# Patient Record
Sex: Male | Born: 1965 | Hispanic: Yes | Marital: Married | Smoking: Light tobacco smoker
Health system: Southern US, Community
[De-identification: ages and names within clinical notes are randomized; demographics above are authoritative.]

## PROBLEM LIST (undated history)

## (undated) DIAGNOSIS — J45909 Unspecified asthma, uncomplicated: Secondary | ICD-10-CM

## (undated) DIAGNOSIS — R7303 Prediabetes: Secondary | ICD-10-CM

## (undated) DIAGNOSIS — J302 Other seasonal allergic rhinitis: Secondary | ICD-10-CM

## (undated) HISTORY — PX: OTHER SURGICAL HISTORY: SHX169

---

## 2014-12-04 ENCOUNTER — Ambulatory Visit: Payer: Self-pay | Admitting: Unknown Physician Specialty

## 2015-06-14 ENCOUNTER — Encounter: Payer: Self-pay | Admitting: *Deleted

## 2015-06-15 ENCOUNTER — Ambulatory Visit
Admission: RE | Admit: 2015-06-15 | Discharge: 2015-06-15 | Disposition: A | Discharge status: Home / Self Care | Payer: Medicaid Other | Source: Ambulatory Visit | Attending: Gastroenterology | Admitting: Gastroenterology

## 2015-06-15 ENCOUNTER — Ambulatory Visit: Payer: Medicaid Other | Admitting: Anesthesiology

## 2015-06-15 ENCOUNTER — Encounter
Admission: RE | Disposition: A | Discharge status: Home / Self Care | Payer: Self-pay | Source: Ambulatory Visit | Attending: Gastroenterology

## 2015-06-15 DIAGNOSIS — Z79899 Other long term (current) drug therapy: Secondary | ICD-10-CM | POA: Diagnosis not present

## 2015-06-15 DIAGNOSIS — E669 Obesity, unspecified: Secondary | ICD-10-CM | POA: Diagnosis not present

## 2015-06-15 DIAGNOSIS — E119 Type 2 diabetes mellitus without complications: Secondary | ICD-10-CM | POA: Insufficient documentation

## 2015-06-15 DIAGNOSIS — D122 Benign neoplasm of ascending colon: Secondary | ICD-10-CM | POA: Diagnosis not present

## 2015-06-15 DIAGNOSIS — J45909 Unspecified asthma, uncomplicated: Secondary | ICD-10-CM | POA: Diagnosis not present

## 2015-06-15 DIAGNOSIS — F17210 Nicotine dependence, cigarettes, uncomplicated: Secondary | ICD-10-CM | POA: Insufficient documentation

## 2015-06-15 DIAGNOSIS — D124 Benign neoplasm of descending colon: Secondary | ICD-10-CM | POA: Insufficient documentation

## 2015-06-15 DIAGNOSIS — Z1211 Encounter for screening for malignant neoplasm of colon: Secondary | ICD-10-CM | POA: Diagnosis not present

## 2015-06-15 HISTORY — DX: Unspecified asthma, uncomplicated: J45.909

## 2015-06-15 HISTORY — PX: COLONOSCOPY WITH PROPOFOL: SHX5780

## 2015-06-15 HISTORY — DX: Other seasonal allergic rhinitis: J30.2

## 2015-06-15 LAB — GLUCOSE, CAPILLARY: Glucose-Capillary: 84 mg/dL (ref 65–99)

## 2015-06-15 SURGERY — COLONOSCOPY WITH PROPOFOL
Anesthesia: General

## 2015-06-15 MED ORDER — MIDAZOLAM HCL 2 MG/2ML IJ SOLN
INTRAMUSCULAR | Status: DC | PRN
  Administered 2015-06-15: 1 mg via INTRAVENOUS

## 2015-06-15 MED ORDER — LIDOCAINE HCL (CARDIAC) 20 MG/ML IV SOLN
INTRAVENOUS | Status: DC | PRN
  Administered 2015-06-15: 60 mg via INTRAVENOUS

## 2015-06-15 MED ORDER — SODIUM CHLORIDE 0.9 % IV SOLN
INTRAVENOUS | Status: DC
  Administered 2015-06-15: 18:00:00 via INTRAVENOUS
  Administered 2015-06-15: 1000 mL via INTRAVENOUS

## 2015-06-15 MED ORDER — SPOT INK MARKER SYRINGE KIT
PACK | SUBMUCOSAL | Status: DC | PRN
  Administered 2015-06-15: 4 mL via SUBMUCOSAL

## 2015-06-15 MED ORDER — PROPOFOL 10 MG/ML IV BOLUS
INTRAVENOUS | Status: DC | PRN
  Administered 2015-06-15: 100 mg via INTRAVENOUS

## 2015-06-15 MED ORDER — PROPOFOL INFUSION 10 MG/ML OPTIME
INTRAVENOUS | Status: DC | PRN
  Administered 2015-06-15: 120 ug/kg/min via INTRAVENOUS

## 2015-06-15 NOTE — H&P (Signed)
Outpatient short stay form Pre-procedure 06/15/2015 5:15 PM Lollie Sails MD  Primary Physician: Dr Gwynneth Aliment  Reason for visit:  Colonoscopy  History of present illness:  Patient is a 49 year old male setting for colonoscopy in regards to change of bowel habits with diarrhea. He also apparently has a family history of some type of mass lesion in the colon, family members were uncertain other this could've been colon cancer or polyp or more specifics. He takes no aspirin or NSAID products or anticoagulation medicines. He tolerated his prep well    Current facility-administered medications:  .  0.9 %  sodium chloride infusion, , Intravenous, Continuous, Lollie Sails, MD, Last Rate: 20 mL/hr at 06/15/15 1522, 1,000 mL at 06/15/15 1522  Prescriptions prior to admission  Medication Sig Dispense Refill Last Dose  . albuterol (PROVENTIL HFA;VENTOLIN HFA) 108 (90 BASE) MCG/ACT inhaler Inhale 2 puffs into the lungs every 6 (six) hours as needed for wheezing or shortness of breath.   Past Week at Unknown time  . fluticasone (FLONASE) 50 MCG/ACT nasal spray Place 2 sprays into both nostrils daily.   Past Week at Unknown time  . ipratropium-albuterol (DUONEB) 0.5-2.5 (3) MG/3ML SOLN Take 2.5 mLs by nebulization every 6 (six) hours as needed (WHEEZING).   Past Week at Unknown time  . loperamide (IMODIUM) 2 MG capsule Take 2 mg by mouth as needed for diarrhea or loose stools. 4 (four) times daily as needed for diarrhea   Past Week at Unknown time  . loratadine (CLARITIN) 10 MG tablet Take 10 mg by mouth daily.   Past Month at Unknown time  . psyllium (METAMUCIL) 58.6 % packet Take 1 packet by mouth daily. 3.4 gram packet   Past Week at Unknown time  . metFORMIN (GLUCOPHAGE) 500 MG tablet Take by mouth 2 (two) times daily with a meal.   Not Taking at Unknown time  . naproxen (EC NAPROSYN) 500 MG EC tablet Take 500 mg by mouth 2 (two) times daily with a meal.   Completed Course at Unknown time      No Known Allergies   Past Medical History  Diagnosis Date  . Asthma   . Diabetes mellitus without complication   . Seasonal allergies     Review of systems:      Physical Exam    Heart and lungs: Regular rate and rhythm without rub or gallop lungs are bilaterally clear    HEENT: Norm cephalic atraumatic eyes are anicteric    Other:     Pertinant exam for procedure: Soft nontender nondistended bowel sounds positive normoactive slight protuberance.    Planned proceedures: Anoscopy and indicated procedures I have discussed the risks benefits and complications of procedures to include not limited to bleeding, infection, perforation and the risk of sedation and the patient wishes to proceed.    Lollie Sails, MD Gastroenterology 06/15/2015  5:15 PM

## 2015-06-15 NOTE — Anesthesia Postprocedure Evaluation (Signed)
  Anesthesia Post-op Note  Patient: Chad Flynn  Procedure(s) Performed: Procedure(s): COLONOSCOPY WITH PROPOFOL (N/A)  Anesthesia type:General  Patient location: PACU  Post pain: Pain level controlled  Post assessment: Post-op Vital signs reviewed, Patient's Cardiovascular Status Stable, Respiratory Function Stable, Patent Airway and No signs of Nausea or vomiting  Post vital signs: Reviewed and stable  Last Vitals:  Filed Vitals:   06/15/15 1835  BP: 110/80  Pulse: 71  Temp:   Resp: 15    Level of consciousness: awake, alert  and patient cooperative  Complications: No apparent anesthesia complications

## 2015-06-15 NOTE — Op Note (Signed)
[facility] Gastroenterology Patient Name: Chad Flynn Procedure Date: 06/15/2015 5:01 PM MRN: [id number] Account #: [id number] Date of Birth: [birth year] Admit Type: Outpatient Age: 49 Room: [facility] ENDO ROOM 3 Gender: Male Note Status: Finalized Procedure:         Colonoscopy Indications:       Screening for colorectal malignant neoplasm Providers:         Lollie Sails, MD Medicines:         Monitored Anesthesia Care Complications:     No immediate complications. Procedure:         Pre-Anesthesia Assessment:                    - ASA Grade Assessment: II - A patient with mild systemic                     disease.                    After obtaining informed consent, the colonoscope was                     passed under direct vision. Throughout the procedure, the                     patient's blood pressure, pulse, and oxygen saturations                     were monitored continuously. The Colonoscope was                     introduced through the anus and advanced to the the cecum,                     identified by appendiceal orifice and ileocecal valve. The                     colonoscopy was performed with moderate difficulty. The                     patient tolerated the procedure well. The quality of the                     bowel preparation was fair except the ascending colon was                     poor. Findings:      A 6 mm polyp was found in the proximal ascending colon. The polyp was       sessile. The polyp was removed with a cold snare. Resection and       retrieval were complete.      A 25 mm polyp was found in the proximal descending colon. The polyp was       sessile. The polyp was removed with a hot snare. The polyp was removed       with a saline injection-lift technique using a hot snare. The polyp was       removed with a piecemeal technique using a hot snare. Resection was       complete, but the polyp tissue was only partially  retrieved. Area was       tattooed with an injection of 4 mL of [place] ink. Two hemostatic clips       were successfully placed. There was no bleeding at the end of the  maneuver.      A 3 mm polyp was found in the distal sigmoid colon. The polyp was       sessile. The polyp was removed with a cold biopsy forceps. Resection and       retrieval were complete.      Biopsies for histology were taken with a cold forceps from the right       colon and left colon for evaluation of microscopic colitis.      The digital rectal exam was normal. Impression:        - One 6 mm polyp in the proximal ascending colon. Resected                     and retrieved.                    - One 25 mm polyp in the proximal descending colon.                     Complete resection. Partial retrieval. Tattooed. Clips                     were placed. Recommendation:    - Await pathology results.                    - Telephone GI clinic for pathology results in 1 week. Procedure Code(s): --- Professional ---                    [phone], 75, Colonoscopy, flexible; with endoscopic mucosal                     resection                    [phone], Colonoscopy, flexible; with removal of tumor(s),                     polyp(s), or other lesion(s) by snare technique                    [id number], 60, Colonoscopy, flexible; with biopsy, single or                     multiple                    45381, 59, Colonoscopy, flexible; with directed submucosal                     injection(s), any substance Diagnosis Code(s): --- Professional ---                    V76.51, Special screening for malignant neoplasms of colon                    211.3, Benign neoplasm of colon CPT copyright 2014 American Medical Association. All rights reserved. The codes documented in this report are preliminary and upon coder review may  be revised to meet current compliance requirements. Lollie Sails, MD 06/15/2015 6:21:24 PM This report has been  signed electronically. Number of Addenda: 0 Note Initiated On: 06/15/2015 5:01 PM Scope Withdrawal Time: 0 hours 41 minutes 35 seconds  Total Procedure Duration: 0 hours 50 minutes 15 seconds       [facility]

## 2015-06-15 NOTE — Anesthesia Preprocedure Evaluation (Signed)
Anesthesia Evaluation  Patient identified by MRN, date of birth, ID band Patient awake    Reviewed: Allergy & Precautions, NPO status , Patient's Chart, lab work & pertinent test results  History of Anesthesia Complications Negative for: history of anesthetic complications  Airway Mallampati: III       Dental no notable dental hx.    Pulmonary asthma , Current Smoker,    + decreased breath sounds      Cardiovascular negative cardio ROS Normal cardiovascular exam    Neuro/Psych    GI/Hepatic negative GI ROS, Neg liver ROS,   Endo/Other  diabetes, Type 2  Renal/GU      Musculoskeletal   Abdominal (+) + obese,   Peds negative pediatric ROS (+)  Hematology   Anesthesia Other Findings   Reproductive/Obstetrics                             Anesthesia Physical Anesthesia Plan  ASA: II  Anesthesia Plan: General   Post-op Pain Management:    Induction: Intravenous  Airway Management Planned: Nasal Cannula  Additional Equipment:   Intra-op Plan:   Post-operative Plan:   Informed Consent: I have reviewed the patients History and Physical, chart, labs and discussed the procedure including the risks, benefits and alternatives for the proposed anesthesia with the patient or authorized representative who has indicated his/her understanding and acceptance.     Plan Discussed with: CRNA  Anesthesia Plan Comments:         Anesthesia Quick Evaluation

## 2015-06-15 NOTE — Transfer of Care (Signed)
Immediate Anesthesia Transfer of Care Note  Patient: Chad Flynn  Procedure(s) Performed: Procedure(s): COLONOSCOPY WITH PROPOFOL (N/A)  Patient Location: PACU  Anesthesia Type:General  Level of Consciousness: awake, alert , oriented and patient cooperative  Airway & Oxygen Therapy: Patient Spontanous Breathing  Post-op Assessment: Report given to RN and Post -op Vital signs reviewed and stable  Post vital signs: Reviewed and stable  Last Vitals:  Filed Vitals:   06/15/15 1521  Pulse: 79  Temp: 37.4 C  Resp: 21    Complications: No apparent anesthesia complications

## 2015-06-15 NOTE — OR Nursing (Signed)
Proximal descending colon polyp site lifted with 32mL NS

## 2015-06-16 ENCOUNTER — Encounter: Payer: Self-pay | Admitting: Gastroenterology

## 2015-06-17 LAB — SURGICAL PATHOLOGY

## 2018-03-20 ENCOUNTER — Other Ambulatory Visit: Payer: Self-pay

## 2018-03-20 ENCOUNTER — Telehealth: Payer: Self-pay

## 2018-03-20 DIAGNOSIS — Z86010 Personal history of colonic polyps: Secondary | ICD-10-CM

## 2018-03-20 NOTE — Telephone Encounter (Signed)
Patients colonoscopy instructions have been mailed to him in Spanish(Copies).  Thanks [employer]

## 2018-04-26 ENCOUNTER — Ambulatory Visit: Payer: Self-pay | Admitting: Anesthesiology

## 2018-04-26 ENCOUNTER — Ambulatory Visit
Admission: RE | Admit: 2018-04-26 | Discharge: 2018-04-26 | Disposition: A | Discharge status: Home / Self Care | Payer: Medicaid Other | Source: Ambulatory Visit | Attending: Gastroenterology | Admitting: Gastroenterology

## 2018-04-26 ENCOUNTER — Encounter: Payer: Self-pay | Admitting: *Deleted

## 2018-04-26 ENCOUNTER — Encounter
Admission: RE | Disposition: A | Discharge status: Home / Self Care | Payer: Self-pay | Source: Ambulatory Visit | Attending: Gastroenterology

## 2018-04-26 ENCOUNTER — Other Ambulatory Visit: Payer: Self-pay

## 2018-04-26 DIAGNOSIS — Z860100 Personal history of colon polyps, unspecified: Secondary | ICD-10-CM

## 2018-04-26 DIAGNOSIS — Z79899 Other long term (current) drug therapy: Secondary | ICD-10-CM | POA: Insufficient documentation

## 2018-04-26 DIAGNOSIS — Z1211 Encounter for screening for malignant neoplasm of colon: Secondary | ICD-10-CM | POA: Insufficient documentation

## 2018-04-26 DIAGNOSIS — K648 Other hemorrhoids: Secondary | ICD-10-CM | POA: Insufficient documentation

## 2018-04-26 DIAGNOSIS — Z7984 Long term (current) use of oral hypoglycemic drugs: Secondary | ICD-10-CM | POA: Insufficient documentation

## 2018-04-26 DIAGNOSIS — R7303 Prediabetes: Secondary | ICD-10-CM | POA: Insufficient documentation

## 2018-04-26 HISTORY — DX: Prediabetes: R73.03

## 2018-04-26 HISTORY — PX: COLONOSCOPY WITH PROPOFOL: SHX5780

## 2018-04-26 LAB — GLUCOSE, CAPILLARY: GLUCOSE-CAPILLARY: 103 mg/dL — AB (ref 70–99)

## 2018-04-26 SURGERY — COLONOSCOPY WITH PROPOFOL
Anesthesia: General

## 2018-04-26 MED ORDER — LACTATED RINGERS IV SOLN
INTRAVENOUS | Status: DC | PRN
  Administered 2018-04-26: 09:00:00 via INTRAVENOUS

## 2018-04-26 MED ORDER — PROPOFOL 10 MG/ML IV BOLUS
INTRAVENOUS | Status: AC
  Filled 2018-04-26: qty 60

## 2018-04-26 MED ORDER — SODIUM CHLORIDE 0.9 % IV SOLN
INTRAVENOUS | Status: DC
  Administered 2018-04-26: 09:00:00 via INTRAVENOUS

## 2018-04-26 MED ORDER — PROPOFOL 500 MG/50ML IV EMUL
INTRAVENOUS | Status: DC | PRN
  Administered 2018-04-26: 150 ug/kg/min via INTRAVENOUS

## 2018-04-26 MED ORDER — PROPOFOL 10 MG/ML IV BOLUS
INTRAVENOUS | Status: DC | PRN
  Administered 2018-04-26: 100 mg via INTRAVENOUS

## 2018-04-26 NOTE — Anesthesia Postprocedure Evaluation (Signed)
Anesthesia Post Note  Patient: Chad Flynn  Procedure(s) Performed: COLONOSCOPY WITH PROPOFOL (N/A )  Patient location during evaluation: Endoscopy Anesthesia Type: General Level of consciousness: awake and alert and oriented Pain management: pain level controlled Vital Signs Assessment: post-procedure vital signs reviewed and stable Respiratory status: spontaneous breathing Cardiovascular status: blood pressure returned to baseline Anesthetic complications: no     Last Vitals:  Vitals:   04/26/18 1000 04/26/18 1010  BP: (!) 117/91 111/90  Pulse: 70 61  Resp: 13 10  Temp:    SpO2: 99% 98%    Last Pain:  Vitals:   04/26/18 0940  TempSrc: Tympanic  PainSc:                  [name]

## 2018-04-26 NOTE — Transfer of Care (Signed)
Immediate Anesthesia Transfer of Care Note  Patient: Chad Flynn  Procedure(s) Performed: COLONOSCOPY WITH PROPOFOL (N/A )  Patient Location: PACU  Anesthesia Type:General  Level of Consciousness: sedated  Airway & Oxygen Therapy: Patient Spontanous Breathing and Patient connected to nasal cannula oxygen  Post-op Assessment: Report given to RN and Post -op Vital signs reviewed and stable  Post vital signs: Reviewed and stable  Last Vitals:  Vitals Value Taken Time  BP    Temp    Pulse    Resp    SpO2      Last Pain:  Vitals:   04/26/18 0818  TempSrc: Tympanic  PainSc: 0-No pain         Complications: No apparent anesthesia complications

## 2018-04-26 NOTE — Op Note (Signed)
[facility] Gastroenterology Patient Name: Chad Flynn Procedure Date: 04/26/2018 9:03 AM MRN: [id number] Account #: [id number] Date of Birth: 07/28/1966 Admit Type: Outpatient Age: 52 Room: [facility] ENDO ROOM 4 Gender: Male Note Status: Finalized Procedure:            Colonoscopy Indications:          Surveillance: Personal history of adenomatous polyps on                        last colonoscopy 3 years ago. Only one previous                        colonoscopy. It was done by Dr. Gustavo Lah for screening.                        Report notes a 76mm ascending colon polyp removed and a                        59mm descending colon polyp removed and tattoo placed                        at the descending colon polyp site. Pathology showed                        Tubular adenoma. See previous report for details Providers:            Talullah Abate B. Bonna Gains MD, MD Referring MD:         Theotis Burrow (Referring MD) Medicines:            Monitored Anesthesia Care Complications:        No immediate complications. Procedure:            Pre-Anesthesia Assessment:                       - Prior to the procedure, a History and Physical was                        performed, and patient medications, allergies and                        sensitivities were reviewed. The patient's tolerance of                        previous anesthesia was reviewed.                       - The risks and benefits of the procedure and the                        sedation options and risks were discussed with the                        patient. All questions were answered and informed                        consent was obtained.                       - Patient identification and proposed procedure were  verified prior to the procedure by the physician, the                        nurse, the anesthetist and the technician. The                        procedure was verified in the  pre-procedure area in the                        procedure room in the endoscopy suite.                       - ASA Grade Assessment: II - A patient with mild                        systemic disease.                       - After reviewing the risks and benefits, the patient                        was deemed in satisfactory condition to undergo the                        procedure.                       After obtaining informed consent, the colonoscope was                        passed under direct vision. Throughout the procedure,                        the patient's blood pressure, pulse, and oxygen                        saturations were monitored continuously. The                        Colonoscope was introduced through the anus and                        advanced to the the cecum, identified by appendiceal                        orifice and ileocecal valve. The colonoscopy was                        performed with ease. The patient tolerated the                        procedure well. The quality of the bowel preparation                        was good. Findings:      The perianal and digital rectal examinations were normal.      A tattoo was seen in the transverse colon at a site of previous       tattooing. The tattoo site appeared normal. (The previous procedure       report by Dr. Gustavo Lah mentions the tattoo to be  in the descending       colon, but the tattoo site was seen in the transverse colon).      The rectum, sigmoid colon, descending colon, transverse colon, ascending       colon and cecum appeared normal.      The retroflexed view of the distal rectum and anal verge was normal and       showed no anal or rectal abnormalities.      Internal hemorrhoids were found during retroflexion. Impression:           - A tattoo was seen in the transverse colon. The tattoo                        site appeared normal. (The previous procedure report by                        Dr.  Gustavo Lah mentions the tattoo to be in the                        descending colon, but the tattoo site was seen in the                        transverse colon today. He has had only one colonoscopy                        before today, and only one tattoo was placed at the                        time. This is the thus the same tattoo placed in 2016                        by Dr. Gustavo Lah).                       - The rectum, sigmoid colon, descending colon,                        transverse colon, ascending colon and cecum are normal.                       - The distal rectum and anal verge are normal on                        retroflexion view.                       - Internal hemorrhoids.                       - No specimens collected. Recommendation:       - Discharge patient to home.                       - Resume previous diet.                       - Continue present medications.                       - Repeat colonoscopy in 3 years for surveillance, due  to previous history of advanced adenoma (51mm                        polypectomy in 2016 and guidelines recommend repeat                        surveillance intervals to be based on history of                        advanced adenomas).                       - Return to primary care physician as previously                        scheduled.                       - The findings and recommendations were discussed with                        the patient.                       - The findings and recommendations were discussed with                        the patient's family.                       - High fiber diet. Procedure Code(s):    --- Professional ---                       J8563, Colorectal cancer screening; colonoscopy on                        individual at high risk Diagnosis Code(s):    --- Professional ---                       Z86.010, Personal history of colonic polyps                       K64.8,  Other hemorrhoids CPT copyright 2017 American Medical Association. All rights reserved. The codes documented in this report are preliminary and upon coder review may  be revised to meet current compliance requirements.  Vonda Antigua, MD Margretta Sidle B. Bonna Gains MD, MD 04/26/2018 9:46:58 AM This report has been signed electronically. Number of Addenda: 0 Note Initiated On: 04/26/2018 9:03 AM Scope Withdrawal Time: 0 hours 14 minutes 46 seconds  Total Procedure Duration: 0 hours 22 minutes 8 seconds       [facility]

## 2018-04-26 NOTE — H&P (Signed)
Chad Antigua, MD [address], [place], [place], [place], [address] [address], [place], [place], [place], [address] Phone: [phone]  Fax: [phone]  Primary Care Physician:  Theotis Burrow, MD   Pre-Procedure History & Physical: HPI:  Chad Flynn is a 52 y.o. male is here for a colonoscopy.   Past Medical History:  Diagnosis Date  . Asthma   . Pre-diabetes   . Seasonal allergies     Past Surgical History:  Procedure Laterality Date  . Arthroscopic subacromial decompression of left shoulder, plus incision cuff repair Left   . COLONOSCOPY WITH PROPOFOL N/A 06/15/2015   Procedure: COLONOSCOPY WITH PROPOFOL;  Surgeon: Lollie Sails, MD;  Location: [facility] ENDOSCOPY;  Service: Endoscopy;  Laterality: N/A;    Prior to Admission medications   Medication Sig Start Date End Date Taking? Authorizing Provider  loperamide (IMODIUM) 2 MG capsule Take 2 mg by mouth as needed for diarrhea or loose stools. 4 (four) times daily as needed for diarrhea   Yes [provider]  loratadine (CLARITIN) 10 MG tablet Take 10 mg by mouth daily.   Yes [provider]  naproxen (EC NAPROSYN) 500 MG EC tablet Take 500 mg by mouth 2 (two) times daily with a meal.   Yes [provider]  albuterol (PROVENTIL HFA;VENTOLIN HFA) 108 (90 BASE) MCG/ACT inhaler Inhale 2 puffs into the lungs every 6 (six) hours as needed for wheezing or shortness of breath.    [provider]  fluticasone (FLONASE) 50 MCG/ACT nasal spray Place 2 sprays into both nostrils daily.    [provider]  ipratropium-albuterol (DUONEB) 0.5-2.5 (3) MG/3ML SOLN Take 2.5 mLs by nebulization every 6 (six) hours as needed (WHEEZING).    [provider]  metFORMIN (GLUCOPHAGE) 500 MG tablet Take by mouth 2 (two) times daily with a meal.    [provider]    Allergies as of 03/20/2018  . (No Known Allergies)    History reviewed. No pertinent  family history.  Social History   Socioeconomic History  . Marital status: Married    Spouse name: Not on file  . Number of children: Not on file  . Years of education: Not on file  . Highest education level: Not on file  Occupational History  . Not on file  Social Needs  . Financial resource strain: Not on file  . Food insecurity:    Worry: Not on file    Inability: Not on file  . Transportation needs:    Medical: Not on file    Non-medical: Not on file  Tobacco Use  . Smoking status: Light Tobacco Smoker    Types: Cigarettes  . Smokeless tobacco: Never Used  . Tobacco comment: 1 or 2 per week  Substance and Sexual Activity  . Alcohol use: No  . Drug use: No  . Sexual activity: Not on file  Lifestyle  . Physical activity:    Days per week: Not on file    Minutes per session: Not on file  . Stress: Not on file  Relationships  . Social connections:    Talks on phone: Not on file    Gets together: Not on file    Attends religious service: Not on file    Active member of club or organization: Not on file    Attends meetings of clubs or organizations: Not on file    Relationship status: Not on file  . Intimate partner violence:  Fear of current or ex partner: Not on file    Emotionally abused: Not on file    Physically abused: Not on file    Forced sexual activity: Not on file  Other Topics Concern  . Not on file  Social History Narrative  . Not on file    Review of Systems: See HPI, otherwise negative ROS  Physical Exam: BP 132/86   Pulse 72   Temp (!) 96.2 F (35.7 C) (Tympanic)   Resp 15   Ht 5\' 7"  (1.702 m)   Wt 208 lb (94.3 kg)   SpO2 100%   BMI 32.58 kg/m  General:   Alert,  pleasant and cooperative in NAD Head:  Normocephalic and atraumatic. Neck:  Supple; no masses or thyromegaly. Lungs:  Clear throughout to auscultation, normal respiratory effort.    Heart:  +S1, +S2, Regular rate and rhythm, No edema. Abdomen:  Soft, nontender and  nondistended. Normal bowel sounds, without guarding, and without rebound.   Neurologic:  Alert and  oriented x4;  grossly normal neurologically.  Impression/Plan: Chad Flynn is here for a colonoscopy to be performed for polyp surveillance. Previous colonoscopy in 2016 by Dr. Gustavo Lah, see report in chart.   Risks, benefits, limitations, and alternatives regarding  colonoscopy have been reviewed with the patient.  Questions have been answered.  All parties agreeable.   Virgel Manifold, MD  04/26/2018, 9:08 AM

## 2018-04-26 NOTE — Anesthesia Procedure Notes (Signed)
Date/Time: 04/26/2018 9:15 AM Performed by: Nelda Marseille, CRNA Pre-anesthesia Checklist: Patient identified, Emergency Drugs available, Suction available, Patient being monitored and Timeout performed Oxygen Delivery Method: Nasal cannula

## 2018-04-26 NOTE — H&P (Signed)
Jonathon Bellows, MD [address] [place], [place], [place], [address] [address], [place], [place], [place], [address] Phone: [phone]  Fax: [phone]  Primary Care Physician:  Theotis Burrow, MD   Pre-Procedure History & Physical: HPI:  Chad Flynn is a 52 y.o. male is here for an colonoscopy.   Past Medical History:  Diagnosis Date  . Asthma   . Pre-diabetes   . Seasonal allergies     Past Surgical History:  Procedure Laterality Date  . Arthroscopic subacromial decompression of left shoulder, plus incision cuff repair Left   . COLONOSCOPY WITH PROPOFOL N/A 06/15/2015   Procedure: COLONOSCOPY WITH PROPOFOL;  Surgeon: Lollie Sails, MD;  Location: [facility] ENDOSCOPY;  Service: Endoscopy;  Laterality: N/A;    Prior to Admission medications   Medication Sig Start Date End Date Taking? Authorizing Provider  loperamide (IMODIUM) 2 MG capsule Take 2 mg by mouth as needed for diarrhea or loose stools. 4 (four) times daily as needed for diarrhea   Yes [provider]  loratadine (CLARITIN) 10 MG tablet Take 10 mg by mouth daily.   Yes [provider]  naproxen (EC NAPROSYN) 500 MG EC tablet Take 500 mg by mouth 2 (two) times daily with a meal.   Yes [provider]  albuterol (PROVENTIL HFA;VENTOLIN HFA) 108 (90 BASE) MCG/ACT inhaler Inhale 2 puffs into the lungs every 6 (six) hours as needed for wheezing or shortness of breath.    [provider]  fluticasone (FLONASE) 50 MCG/ACT nasal spray Place 2 sprays into both nostrils daily.    [provider]  ipratropium-albuterol (DUONEB) 0.5-2.5 (3) MG/3ML SOLN Take 2.5 mLs by nebulization every 6 (six) hours as needed (WHEEZING).    [provider]  metFORMIN (GLUCOPHAGE) 500 MG tablet Take by mouth 2 (two) times daily with a meal.    [provider]    Allergies as of 03/20/2018  . (No Known Allergies)    History reviewed. No pertinent  family history.  Social History   Socioeconomic History  . Marital status: Married    Spouse name: Not on file  . Number of children: Not on file  . Years of education: Not on file  . Highest education level: Not on file  Occupational History  . Not on file  Social Needs  . Financial resource strain: Not on file  . Food insecurity:    Worry: Not on file    Inability: Not on file  . Transportation needs:    Medical: Not on file    Non-medical: Not on file  Tobacco Use  . Smoking status: Light Tobacco Smoker    Types: Cigarettes  . Smokeless tobacco: Never Used  . Tobacco comment: 1 or 2 per week  Substance and Sexual Activity  . Alcohol use: No  . Drug use: No  . Sexual activity: Not on file  Lifestyle  . Physical activity:    Days per week: Not on file    Minutes per session: Not on file  . Stress: Not on file  Relationships  . Social connections:    Talks on phone: Not on file    Gets together: Not on file    Attends religious service: Not on file    Active member of club or organization: Not on file    Attends meetings of clubs or organizations: Not on file    Relationship status: Not on file  . Intimate  partner violence:    Fear of current or ex partner: Not on file    Emotionally abused: Not on file    Physically abused: Not on file    Forced sexual activity: Not on file  Other Topics Concern  . Not on file  Social History Narrative  . Not on file    Review of Systems: See HPI, otherwise negative ROS  Physical Exam: BP 132/86   Pulse 72   Temp (!) 96.2 F (35.7 C) (Tympanic)   Resp 15   Ht 5\' 7"  (1.702 m)   Wt 208 lb (94.3 kg)   SpO2 100%   BMI 32.58 kg/m  General:   Alert,  pleasant and cooperative in NAD Head:  Normocephalic and atraumatic. Neck:  Supple; no masses or thyromegaly. Lungs:  Clear throughout to auscultation, normal respiratory effort.    Heart:  +S1, +S2, Regular rate and rhythm, No edema. Abdomen:  Soft, nontender and  nondistended. Normal bowel sounds, without guarding, and without rebound.   Neurologic:  Alert and  oriented x4;  grossly normal neurologically.  Impression/Plan: Chad Flynn is here for an colonoscopy to be performed for surveillance due to prior history of colon polyps   Risks, benefits, limitations, and alternatives regarding  colonoscopy have been reviewed with the patient.  Questions have been answered.  All parties agreeable.   Jonathon Bellows, MD  04/26/2018, 8:38 AM

## 2018-04-26 NOTE — Anesthesia Preprocedure Evaluation (Signed)
Anesthesia Evaluation  Patient identified by MRN, date of birth, ID band Patient awake    Reviewed: Allergy & Precautions, NPO status , Patient's Chart, lab work & pertinent test results  History of Anesthesia Complications Negative for: history of anesthetic complications  Airway Mallampati: III       Dental no notable dental hx.    Pulmonary asthma , Current Smoker,     + decreased breath sounds      Cardiovascular negative cardio ROS Normal cardiovascular exam     Neuro/Psych    GI/Hepatic negative GI ROS, Neg liver ROS,   Endo/Other  diabetes, Type 2  Renal/GU      Musculoskeletal   Abdominal (+) + obese,   Peds negative pediatric ROS (+)  Hematology   Anesthesia Other Findings   Reproductive/Obstetrics                             Anesthesia Physical  Anesthesia Plan  ASA: II  Anesthesia Plan: General   Post-op Pain Management:    Induction: Intravenous  PONV Risk Score and Plan:   Airway Management Planned: Nasal Cannula  Additional Equipment:   Intra-op Plan:   Post-operative Plan:   Informed Consent: I have reviewed the patients History and Physical, chart, labs and discussed the procedure including the risks, benefits and alternatives for the proposed anesthesia with the patient or authorized representative who has indicated his/her understanding and acceptance.     Plan Discussed with: CRNA  Anesthesia Plan Comments:         Anesthesia Quick Evaluation

## 2018-04-26 NOTE — Anesthesia Post-op Follow-up Note (Signed)
Anesthesia QCDR form completed.        

## 2018-11-21 ENCOUNTER — Other Ambulatory Visit: Payer: Self-pay | Admitting: Family Medicine

## 2018-11-21 DIAGNOSIS — M545 Low back pain, unspecified: Secondary | ICD-10-CM

## 2018-11-21 DIAGNOSIS — M542 Cervicalgia: Secondary | ICD-10-CM

## 2018-11-29 ENCOUNTER — Ambulatory Visit
Admission: RE | Admit: 2018-11-29 | Discharge: 2018-11-29 | Disposition: A | Discharge status: Home / Self Care | Payer: No Typology Code available for payment source | Source: Ambulatory Visit | Attending: Family Medicine | Admitting: Family Medicine

## 2018-11-29 DIAGNOSIS — M79602 Pain in left arm: Secondary | ICD-10-CM | POA: Diagnosis not present

## 2018-11-29 DIAGNOSIS — M25512 Pain in left shoulder: Secondary | ICD-10-CM | POA: Diagnosis not present

## 2018-11-29 DIAGNOSIS — M545 Low back pain, unspecified: Secondary | ICD-10-CM

## 2018-11-29 DIAGNOSIS — R51 Headache: Secondary | ICD-10-CM | POA: Insufficient documentation

## 2018-11-29 DIAGNOSIS — M48061 Spinal stenosis, lumbar region without neurogenic claudication: Secondary | ICD-10-CM | POA: Insufficient documentation

## 2018-11-29 DIAGNOSIS — M542 Cervicalgia: Secondary | ICD-10-CM | POA: Insufficient documentation

## 2019-03-04 ENCOUNTER — Other Ambulatory Visit: Payer: Self-pay | Admitting: Neurosurgery

## 2019-03-04 ENCOUNTER — Other Ambulatory Visit (HOSPITAL_COMMUNITY): Payer: Self-pay | Admitting: Neurosurgery

## 2019-03-04 DIAGNOSIS — R519 Headache, unspecified: Secondary | ICD-10-CM

## 2019-03-04 DIAGNOSIS — R51 Headache: Principal | ICD-10-CM

## 2019-03-11 ENCOUNTER — Ambulatory Visit: Admission: RE | Admit: 2019-03-11 | Payer: Medicaid Other | Source: Ambulatory Visit

## 2020-07-26 ENCOUNTER — Other Ambulatory Visit: Payer: Self-pay | Admitting: Neurology

## 2020-07-26 ENCOUNTER — Other Ambulatory Visit (HOSPITAL_COMMUNITY): Payer: Self-pay | Admitting: Neurology

## 2020-07-26 DIAGNOSIS — R4189 Other symptoms and signs involving cognitive functions and awareness: Secondary | ICD-10-CM

## 2020-08-16 ENCOUNTER — Ambulatory Visit: Admission: RE | Admit: 2020-08-16 | Payer: Medicaid Other | Source: Ambulatory Visit

## 2020-09-20 ENCOUNTER — Ambulatory Visit
Admission: RE | Admit: 2020-09-20 | Discharge: 2020-09-20 | Disposition: A | Discharge status: Home / Self Care | Payer: Medicaid Other | Source: Ambulatory Visit | Attending: Neurology | Admitting: Neurology

## 2020-09-20 ENCOUNTER — Other Ambulatory Visit: Payer: Self-pay

## 2020-09-20 DIAGNOSIS — R4189 Other symptoms and signs involving cognitive functions and awareness: Secondary | ICD-10-CM

## 2021-04-16 IMAGING — MR MR HEAD W/O CM
12 series · 45 of 48 positions shown · non-contrast
Comparison: None.

CLINICAL DATA: Cognitive impairment.

EXAM:
MRI HEAD WITHOUT CONTRAST
TECHNIQUE: Multiplanar, multiecho pulse sequences of the brain and surrounding
structures were obtained without intravenous contrast.

[Series 5: ax dwi_tracew · axial · 3.0mm · 0.60mm/px · z∈[-107,+48]mm · 4 of 48 slices shown]
[im 1/48]
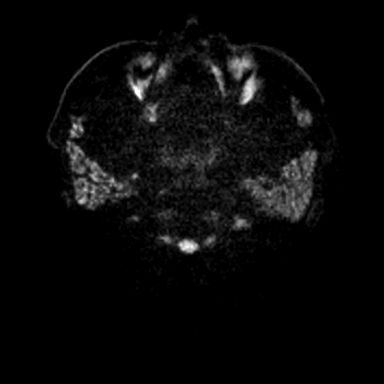
[im 16/48]
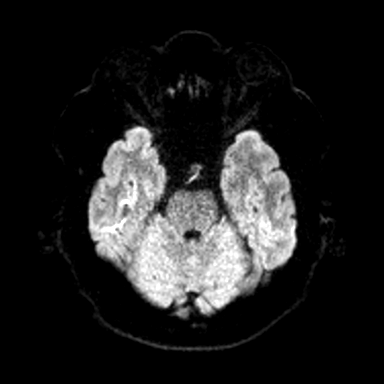
[im 32/48]
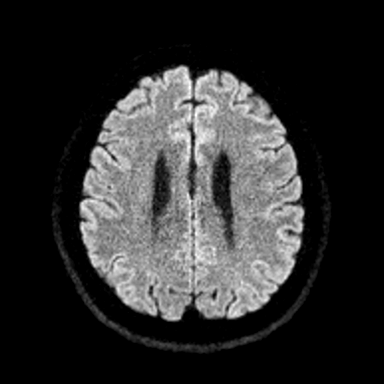
[im 48/48]
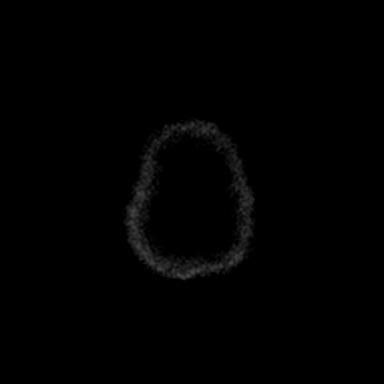

[Series 6: ax dwi_adc · axial · 3.0mm · 0.60mm/px · z∈[-107,+48]mm · 3 of 48 slices shown]
[im 1/48]
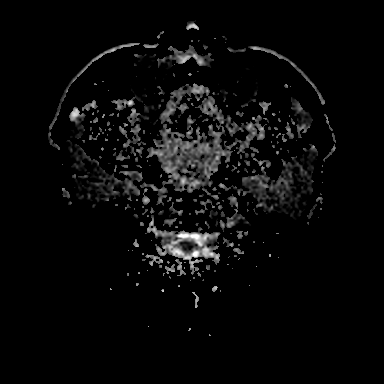
[im 24/48]
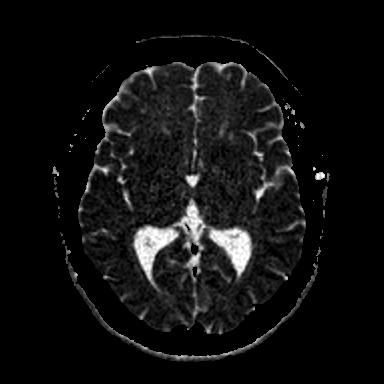
[im 48/48]
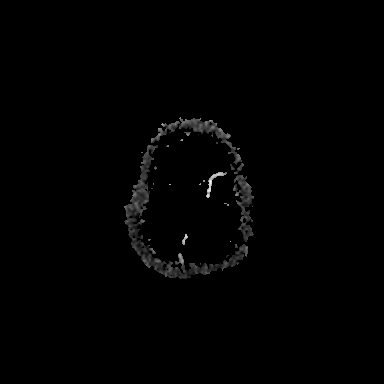

[Series 7: cor dwi_tracew · coronal · 5.0mm · 0.60mm/px · 3 of 40 slices shown]
[im 1/40]
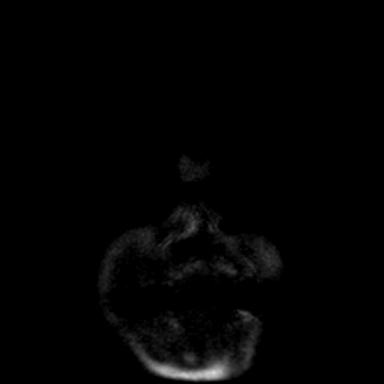
[im 20/40]
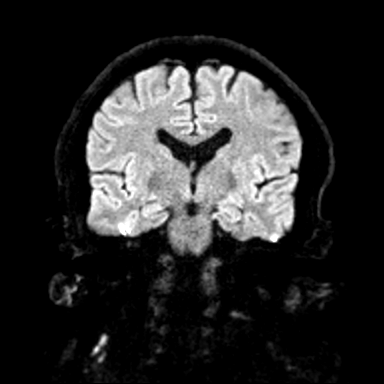
[im 40/40]
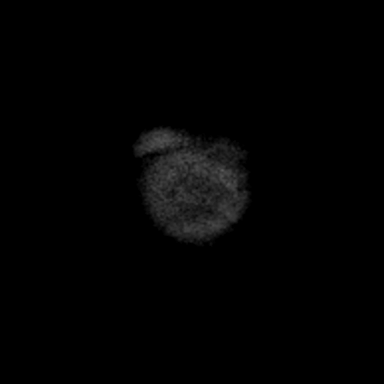

[Series 8: cor dwi_adc · coronal · 5.0mm · 0.60mm/px · 3 of 40 slices shown]
[im 1/40]
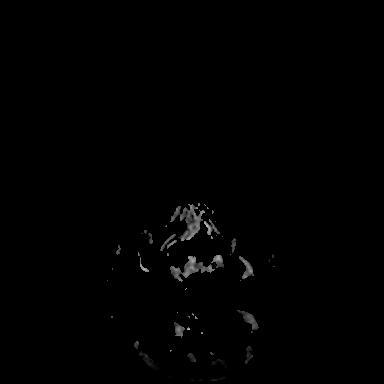
[im 20/40]
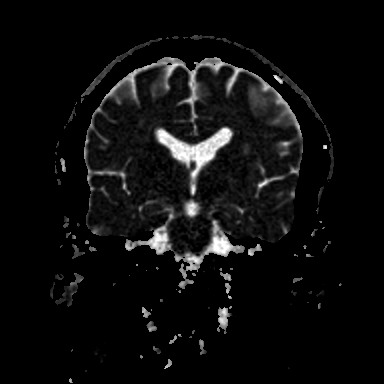
[im 40/40]
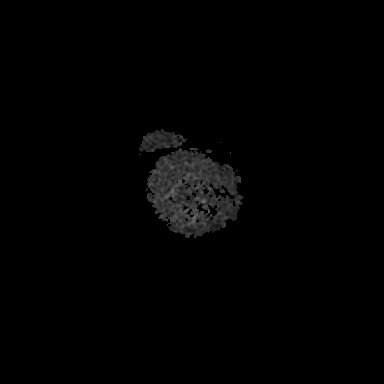

[Series 9: T1 · sagittal · 5.0mm · 0.62mm/px · 2 of 25 slices shown (1 of 2)]
[im 1/25]
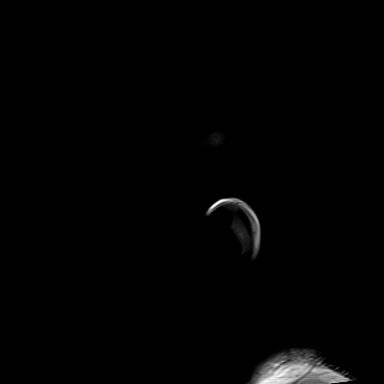
[im 25/25]
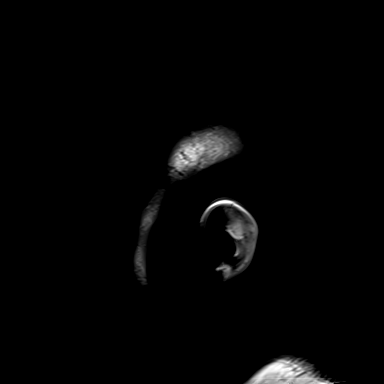

[Series 10: T2 · axial · 5.0mm · 0.53mm/px · z∈[-98,+46]mm · 2 of 25 slices shown (1 of 2)]
[im 1/25]
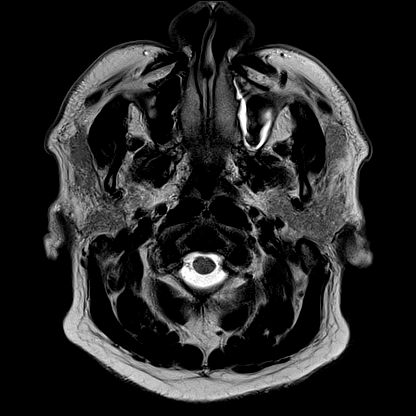
[im 25/25]
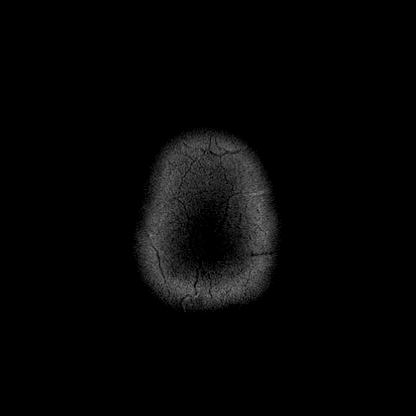

[Series 11: mag_images · axial · 3.0mm · 0.90mm/px · z∈[-114,+62]mm · 4 of 60 slices shown]
[im 1/60]
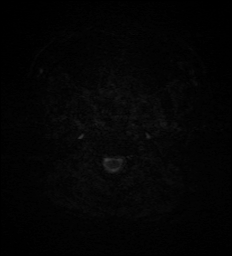
[im 20/60]
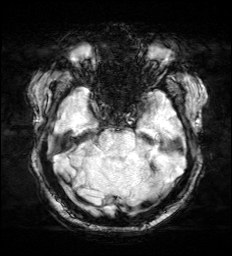
[im 40/60]
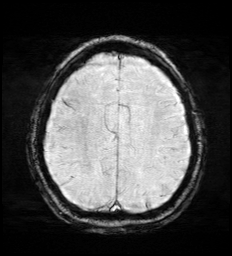
[im 60/60]
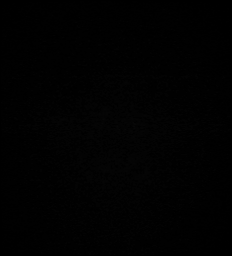

[Series 12: pha_images · axial · 3.0mm · 0.90mm/px · z∈[-114,+62]mm · 4 of 58 slices shown]
[im 1/58]
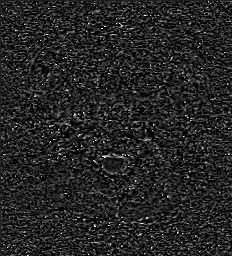
[im 20/58]
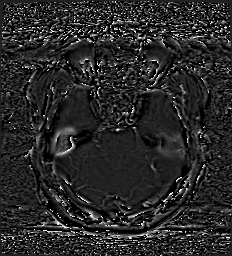
[im 39/58]
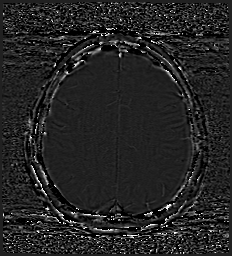
[im 58/58]
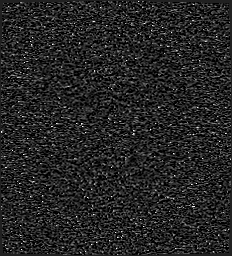

[Series 13: swi_images · axial · 3.0mm · 0.90mm/px · z∈[-114,+62]mm · 4 of 60 slices shown]
[im 1/60]
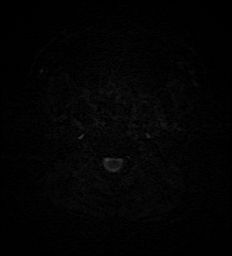
[im 20/60]
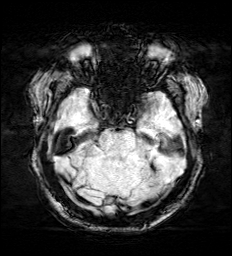
[im 40/60]
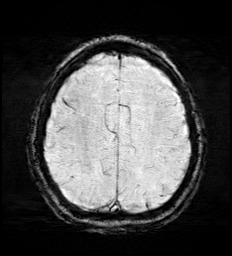
[im 60/60]
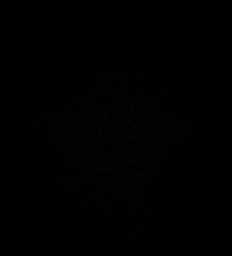

[Series 15: FLAIR · axial · 3.0mm · 0.53mm/px · z∈[-107,+55]mm · 4 of 55 slices shown]
[im 1/55]
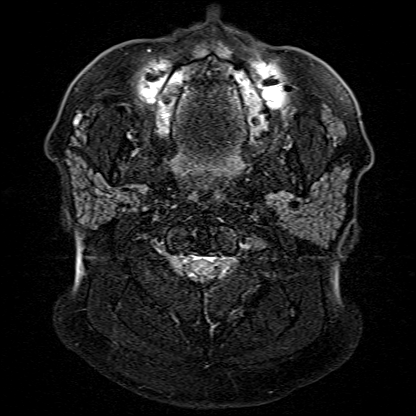
[im 19/55]
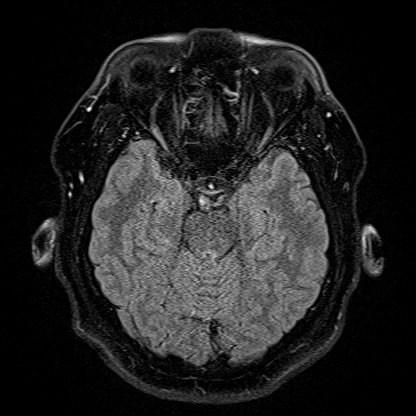
[im 37/55]
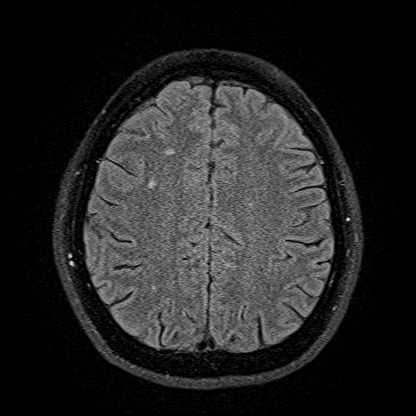
[im 55/55]
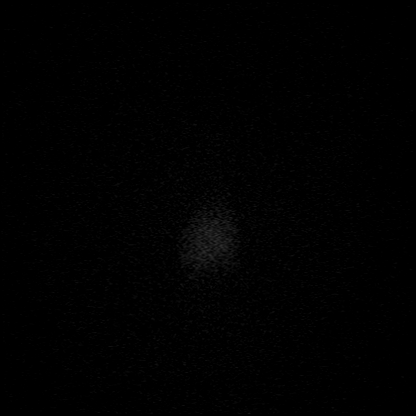

[Series 16: T1 · axial · 1.0mm · 0.98mm/px · z∈[-114,+61]mm · 10 of 176 slices shown (2 of 2)]
[im 1/176]
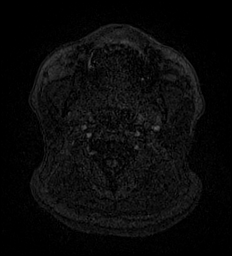
[im 15/176]
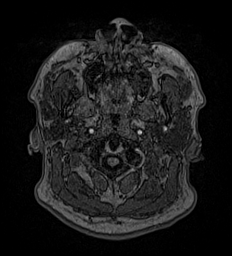
[im 30/176]
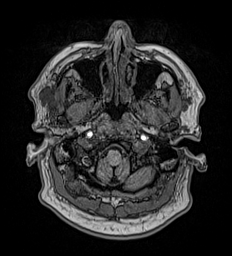
[im 44/176]
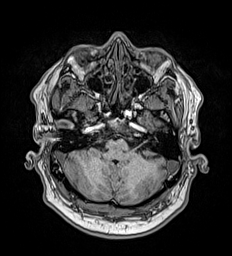
[im 59/176]
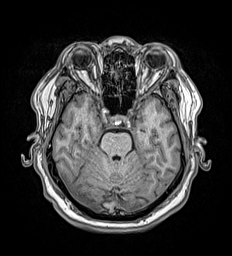
[im 73/176]
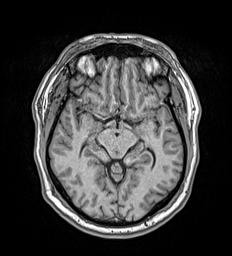
[im 103/176]
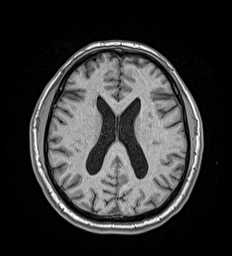
[im 117/176]
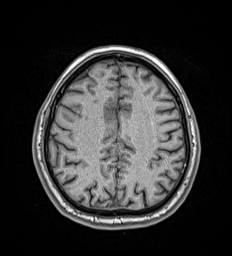
[im 146/176]
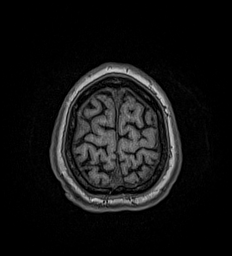
[im 176/176]
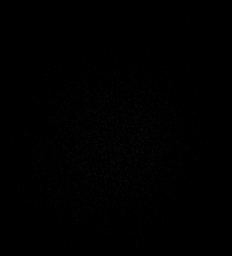

[Series 17: T2 · coronal · 5.0mm · 0.57mm/px · 2 of 29 slices shown (2 of 2)]
[im 1/29]
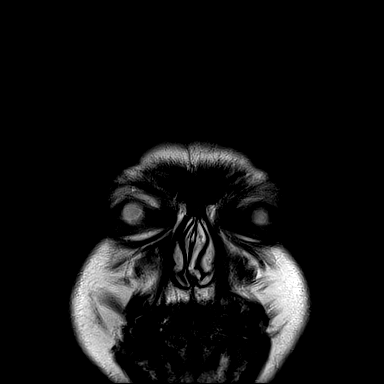
[im 29/29]
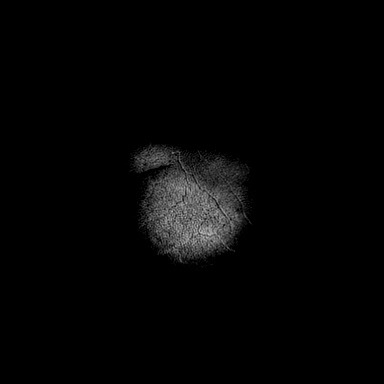

[45 of 48 positions shown; findings below may reference images not displayed]

FINDINGS: Brain: No diffusion-weighted signal abnormality. Remote left frontal
and right occipital microhemorrhages. No midline shift,
ventriculomegaly or extra-axial fluid collection. No mass lesion.
Scattered T2 hyperintense foci involving the periventricular and
subcortical white matter are nonspecific.

Vascular: Normal flow voids.

Skull and upper cervical spine: Normal marrow signal.

Sinuses/Orbits: Normal orbits. Minimal maxillary and left sphenoid
sinus mucosal thickening. No mastoid effusion.

Other: None.
IMPRESSION: No acute intracranial process. Remote left frontal and right
occipital microhemorrhages.

Nonspecific supratentorial white matter signal abnormalities.
Differential includes early chronic microvascular ischemic changes,
post infectious/inflammatory sequela, chronic migraines and
demyelination.

## 2021-07-28 ENCOUNTER — Telehealth: Payer: Self-pay

## 2021-07-28 NOTE — Telephone Encounter (Signed)
Called patient no answer letter sent
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
## 2022-10-17 ENCOUNTER — Ambulatory Visit (INDEPENDENT_AMBULATORY_CARE_PROVIDER_SITE_OTHER): Payer: Medicaid Other | Admitting: Orthopedic Surgery

## 2022-10-17 ENCOUNTER — Encounter: Payer: Self-pay | Admitting: Orthopedic Surgery

## 2022-10-17 VITALS — BP 130/82 | Ht 67.0 in | Wt 217.0 lb

## 2022-10-17 DIAGNOSIS — M47812 Spondylosis without myelopathy or radiculopathy, cervical region: Secondary | ICD-10-CM

## 2022-10-17 DIAGNOSIS — M4722 Other spondylosis with radiculopathy, cervical region: Secondary | ICD-10-CM | POA: Diagnosis not present

## 2022-10-17 DIAGNOSIS — R29898 Other symptoms and signs involving the musculoskeletal system: Secondary | ICD-10-CM

## 2022-10-17 DIAGNOSIS — M5412 Radiculopathy, cervical region: Secondary | ICD-10-CM

## 2022-10-17 NOTE — Patient Instructions (Signed)
It was so nice to see you today, I am sorry that you are hurting so much.   I want to get an updated MRI of your neck to look into pain and weakness that you are having. Will call you to schedule at hospital once it is approved.   I sent a referral to [facility] in [place]. They should call you for an appointment.   Once I have the MRI results back, we will call you to schedule a follow up.   Please do not hesitate to call if you have any questions or concerns. You can also message me in [place].   Geronimo Boot PA-C [phone]

## 2022-12-05 ENCOUNTER — Ambulatory Visit
Admission: RE | Admit: 2022-12-05 | Discharge: 2022-12-05 | Disposition: A | Discharge status: Home / Self Care | Payer: Medicaid Other | Source: Ambulatory Visit | Attending: Orthopedic Surgery | Admitting: Orthopedic Surgery

## 2022-12-05 DIAGNOSIS — M5412 Radiculopathy, cervical region: Secondary | ICD-10-CM | POA: Insufficient documentation

## 2022-12-05 DIAGNOSIS — R29898 Other symptoms and signs involving the musculoskeletal system: Secondary | ICD-10-CM | POA: Insufficient documentation

## 2022-12-05 DIAGNOSIS — M47812 Spondylosis without myelopathy or radiculopathy, cervical region: Secondary | ICD-10-CM
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
## 2022-12-13 ENCOUNTER — Encounter: Payer: Self-pay | Admitting: Psychiatry

## 2022-12-13 ENCOUNTER — Ambulatory Visit: Payer: Medicaid Other | Admitting: Psychiatry
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
## 2022-12-14 ENCOUNTER — Encounter: Payer: Self-pay | Admitting: Orthopedic Surgery

## 2022-12-14 ENCOUNTER — Ambulatory Visit (INDEPENDENT_AMBULATORY_CARE_PROVIDER_SITE_OTHER): Payer: Medicaid Other | Admitting: Orthopedic Surgery

## 2022-12-14 VITALS — BP 122/76 | Ht 67.0 in | Wt 215.4 lb

## 2022-12-14 DIAGNOSIS — R29898 Other symptoms and signs involving the musculoskeletal system: Secondary | ICD-10-CM

## 2022-12-14 DIAGNOSIS — M5412 Radiculopathy, cervical region: Secondary | ICD-10-CM

## 2022-12-14 DIAGNOSIS — M4722 Other spondylosis with radiculopathy, cervical region: Secondary | ICD-10-CM

## 2022-12-14 DIAGNOSIS — M47812 Spondylosis without myelopathy or radiculopathy, cervical region: Secondary | ICD-10-CM

## 2022-12-14 NOTE — Patient Instructions (Signed)
It was so nice to see you today. Thank you so much for coming in.    You have some wear and tear in your neck (arthritis) and I think this is causing your neck, shoulder, and arm pain.  I sent physical therapy orders to [facility]. You can call them at [phone] if you don't hear from them to schedule your visit.   I want you to see physical medicine and rehab at the [facility] to discuss possible injections in your neck. Dr. Sharlet Salina, Dr. Alba Destine, and their PA Loree Fee are great and will take good care of you. They should call you to schedule an appointment or you can call them at [phone].   I will see you back in 6-8 weeks. Please do not hesitate to call if you have any questions or concerns. You can also message me in [place].   If you have not heard back about any of the above things in the next week, please call the office so we can help you get them scheduled.   Geronimo Boot PA-C [phone]
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
## 2022-12-28 ENCOUNTER — Ambulatory Visit (INDEPENDENT_AMBULATORY_CARE_PROVIDER_SITE_OTHER): Payer: Medicaid Other | Admitting: Neurosurgery

## 2022-12-28 ENCOUNTER — Encounter: Payer: Self-pay | Admitting: Neurosurgery

## 2022-12-28 VITALS — BP 117/70 | HR 71 | Ht 67.0 in | Wt 215.2 lb

## 2022-12-28 DIAGNOSIS — M5412 Radiculopathy, cervical region: Secondary | ICD-10-CM

## 2022-12-28 DIAGNOSIS — R29898 Other symptoms and signs involving the musculoskeletal system: Secondary | ICD-10-CM

## 2023-02-09 ENCOUNTER — Ambulatory Visit: Payer: Medicaid Other | Admitting: Orthopedic Surgery

## 2024-09-09 ENCOUNTER — Ambulatory Visit
Admission: RE | Admit: 2024-09-09 | Discharge: 2024-09-09 | Disposition: A | Discharge status: Home / Self Care | Source: Ambulatory Visit | Attending: Nurse Practitioner | Admitting: Nurse Practitioner

## 2024-09-09 ENCOUNTER — Ambulatory Visit
Admission: RE | Admit: 2024-09-09 | Discharge: 2024-09-09 | Disposition: A | Discharge status: Home / Self Care | Attending: Nurse Practitioner | Admitting: Nurse Practitioner

## 2024-09-09 ENCOUNTER — Other Ambulatory Visit: Payer: Self-pay | Admitting: Nurse Practitioner

## 2024-09-09 DIAGNOSIS — J129 Viral pneumonia, unspecified: Secondary | ICD-10-CM | POA: Diagnosis present

## 2024-10-07 ENCOUNTER — Encounter: Admission: RE | Payer: Self-pay | Source: Home / Self Care

## 2024-10-07 ENCOUNTER — Ambulatory Visit: Admission: RE | Admit: 2024-10-07 | Payer: Self-pay | Source: Home / Self Care | Admitting: Internal Medicine

## 2024-10-07 SURGERY — COLONOSCOPY
Anesthesia: General
